# Patient Record
Sex: Male | Born: 1980 | Race: Black or African American | Hispanic: No | Marital: Married
Health system: Southern US, Community
[De-identification: ages and names within clinical notes are randomized; demographics above are authoritative.]

## PROBLEM LIST (undated history)

## (undated) DIAGNOSIS — I10 Essential (primary) hypertension: Secondary | ICD-10-CM

---

## 2019-09-30 ENCOUNTER — Emergency Department (HOSPITAL_COMMUNITY): Payer: Self-pay

## 2019-09-30 ENCOUNTER — Emergency Department (HOSPITAL_COMMUNITY)
Admission: EM | Admit: 2019-09-30 | Discharge: 2019-09-30 | Disposition: A | Discharge status: Home / Self Care | Payer: Self-pay | Attending: Emergency Medicine | Admitting: Emergency Medicine

## 2019-09-30 ENCOUNTER — Encounter (HOSPITAL_COMMUNITY): Payer: Self-pay

## 2019-09-30 ENCOUNTER — Other Ambulatory Visit: Payer: Self-pay

## 2019-09-30 DIAGNOSIS — I10 Essential (primary) hypertension: Secondary | ICD-10-CM

## 2019-09-30 DIAGNOSIS — W3400XA Accidental discharge from unspecified firearms or gun, initial encounter: Secondary | ICD-10-CM | POA: Insufficient documentation

## 2019-09-30 DIAGNOSIS — Y999 Unspecified external cause status: Secondary | ICD-10-CM | POA: Insufficient documentation

## 2019-09-30 DIAGNOSIS — S1193XA Puncture wound without foreign body of unspecified part of neck, initial encounter: Secondary | ICD-10-CM

## 2019-09-30 DIAGNOSIS — S1180XA Unspecified open wound of other specified part of neck, initial encounter: Secondary | ICD-10-CM | POA: Insufficient documentation

## 2019-09-30 DIAGNOSIS — S1181XA Laceration without foreign body of other specified part of neck, initial encounter: Secondary | ICD-10-CM | POA: Insufficient documentation

## 2019-09-30 DIAGNOSIS — Y939 Activity, unspecified: Secondary | ICD-10-CM | POA: Insufficient documentation

## 2019-09-30 DIAGNOSIS — S42191A Fracture of other part of scapula, right shoulder, initial encounter for closed fracture: Secondary | ICD-10-CM | POA: Insufficient documentation

## 2019-09-30 DIAGNOSIS — S21231A Puncture wound without foreign body of right back wall of thorax without penetration into thoracic cavity, initial encounter: Secondary | ICD-10-CM

## 2019-09-30 DIAGNOSIS — S1091XA Abrasion of unspecified part of neck, initial encounter: Secondary | ICD-10-CM

## 2019-09-30 DIAGNOSIS — Z23 Encounter for immunization: Secondary | ICD-10-CM | POA: Insufficient documentation

## 2019-09-30 DIAGNOSIS — E669 Obesity, unspecified: Secondary | ICD-10-CM | POA: Insufficient documentation

## 2019-09-30 DIAGNOSIS — Y9229 Other specified public building as the place of occurrence of the external cause: Secondary | ICD-10-CM | POA: Insufficient documentation

## 2019-09-30 DIAGNOSIS — S42101A Fracture of unspecified part of scapula, right shoulder, initial encounter for closed fracture: Secondary | ICD-10-CM

## 2019-09-30 DIAGNOSIS — S21201A Unspecified open wound of right back wall of thorax without penetration into thoracic cavity, initial encounter: Secondary | ICD-10-CM | POA: Insufficient documentation

## 2019-09-30 HISTORY — DX: Essential (primary) hypertension: I10

## 2019-09-30 LAB — CBC
HCT: 44.3 % (ref 39.0–52.0)
Hemoglobin: 14.2 g/dL (ref 13.0–17.0)
MCH: 28.5 pg (ref 26.0–34.0)
MCHC: 32.1 g/dL (ref 30.0–36.0)
MCV: 88.8 fL (ref 80.0–100.0)
Platelets: 349 10*3/uL (ref 150–400)
RBC: 4.99 MIL/uL (ref 4.22–5.81)
RDW: 12.1 % (ref 11.5–15.5)
WBC: 18.3 10*3/uL — ABNORMAL HIGH (ref 4.0–10.5)
nRBC: 0 % (ref 0.0–0.2)

## 2019-09-30 LAB — COMPREHENSIVE METABOLIC PANEL
ALT: 26 U/L (ref 0–44)
AST: 23 U/L (ref 15–41)
Albumin: 4.3 g/dL (ref 3.5–5.0)
Alkaline Phosphatase: 78 U/L (ref 38–126)
Anion gap: 18 — ABNORMAL HIGH (ref 5–15)
BUN: 11 mg/dL (ref 6–20)
CO2: 21 mmol/L — ABNORMAL LOW (ref 22–32)
Calcium: 9.2 mg/dL (ref 8.9–10.3)
Chloride: 103 mmol/L (ref 98–111)
Creatinine, Ser: 0.8 mg/dL (ref 0.61–1.24)
GFR calc Af Amer: >60 mL/min (ref >60)
GFR calc non Af Amer: >60 mL/min (ref >60)
Glucose, Bld: 218 mg/dL — ABNORMAL HIGH (ref 70–99)
Potassium: 3.5 mmol/L (ref 3.5–5.1)
Sodium: 142 mmol/L (ref 135–145)
Total Bilirubin: 0.5 mg/dL (ref 0.3–1.2)
Total Protein: 8.3 g/dL — ABNORMAL HIGH (ref 6.5–8.1)

## 2019-09-30 LAB — LACTIC ACID, PLASMA: Lactic Acid, Venous: 6.5 mmol/L (ref 0.5–1.9)

## 2019-09-30 LAB — PROTIME-INR
INR: 1 (ref 0.8–1.2)
Prothrombin Time: 12.9 seconds (ref 11.4–15.2)

## 2019-09-30 MED ORDER — CEFAZOLIN SODIUM-DEXTROSE 1-4 GM/50ML-% IV SOLN
1.0000 g | Freq: Once | INTRAVENOUS | Status: AC
  Administered 2019-09-30: 1 g via INTRAVENOUS
  Filled 2019-09-30: qty 50

## 2019-09-30 MED ORDER — TETANUS-DIPHTH-ACELL PERTUSSIS 5-2.5-18.5 LF-MCG/0.5 IM SUSP
0.5000 mL | Freq: Once | INTRAMUSCULAR | Status: AC
  Administered 2019-09-30: 0.5 mL via INTRAMUSCULAR
  Filled 2019-09-30: qty 0.5

## 2019-09-30 MED ORDER — IOHEXOL 350 MG/ML SOLN
80.0000 mL | Freq: Once | INTRAVENOUS | Status: AC | PRN
  Administered 2019-09-30: 80 mL via INTRAVENOUS

## 2019-09-30 MED ORDER — OXYCODONE-ACETAMINOPHEN 5-325 MG PO TABS: 1.0000 | ORAL_TABLET | ORAL | 0 refills | Status: AC | PRN

## 2019-09-30 MED ORDER — LIDOCAINE-EPINEPHRINE (PF) 2 %-1:200000 IJ SOLN
INTRAMUSCULAR | Status: AC
  Administered 2019-09-30: 10 mL via INTRADERMAL
  Filled 2019-09-30: qty 20

## 2019-09-30 MED ORDER — LIDOCAINE-EPINEPHRINE (PF) 2 %-1:200000 IJ SOLN: 10.0000 mL | Freq: Once | INTRAMUSCULAR | Status: AC

## 2019-09-30 MED ORDER — HYDROMORPHONE HCL 1 MG/ML IJ SOLN
1.0000 mg | Freq: Once | INTRAMUSCULAR | Status: AC
  Administered 2019-09-30: 1 mg via INTRAVENOUS
  Filled 2019-09-30: qty 1

## 2019-09-30 MED ORDER — CEPHALEXIN 500 MG PO CAPS: 500.0000 mg | ORAL_CAPSULE | Freq: Four times a day (QID) | ORAL | 0 refills | Status: AC

## 2019-09-30 MED ORDER — SODIUM CHLORIDE (PF) 0.9 % IJ SOLN
INTRAMUSCULAR | Status: AC
  Filled 2019-09-30: qty 50

## 2019-09-30 NOTE — Discharge Instructions (Signed)
Take Percocet for pain and Keflex to avoid an infection in the wound as directed. Follow up with Dr. Susa Simmonds (or an orthopedist of your choice) for recheck of wound and scapular fracture to insure uncomplicated healing.   You should make an appointment with your doctor and have your blood pressure rechecked. It has been high in the ED tonight but will need to be re-evaluated to determine if you have blood pressure that needs treatment.   Return to the ED as needed.
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
